# Patient Record
Sex: Female | Born: 1965 | Race: White | Hispanic: No | Marital: Married | Smoking: Former smoker
Health system: Southern US, Community
[De-identification: ages and names within clinical notes are randomized; demographics above are authoritative.]

## PROBLEM LIST (undated history)

## (undated) HISTORY — PX: ABDOMINAL HYSTERECTOMY: SHX81

## (undated) HISTORY — PX: TUBAL LIGATION: SHX77

## (undated) HISTORY — PX: DILATION AND CURETTAGE OF UTERUS: SHX78

---

## 2014-05-24 ENCOUNTER — Emergency Department: Payer: Self-pay | Admitting: Emergency Medicine

## 2014-05-24 LAB — COMPREHENSIVE METABOLIC PANEL
ALBUMIN: 3.2 g/dL — AB (ref 3.4–5.0)
ALK PHOS: 84 U/L
Anion Gap: 7 (ref 7–16)
BUN: 10 mg/dL (ref 7–18)
Bilirubin,Total: 0.5 mg/dL (ref 0.2–1.0)
CREATININE: 0.66 mg/dL (ref 0.60–1.30)
Calcium, Total: 8.4 mg/dL — ABNORMAL LOW (ref 8.5–10.1)
Chloride: 103 mmol/L (ref 98–107)
Co2: 28 mmol/L (ref 21–32)
EGFR (African American): >60
EGFR (Non-African Amer.): >60
GLUCOSE: 119 mg/dL — AB (ref 65–99)
OSMOLALITY: 276 (ref 275–301)
POTASSIUM: 3.4 mmol/L — AB (ref 3.5–5.1)
SGOT(AST): 18 U/L (ref 15–37)
SGPT (ALT): 15 U/L
SODIUM: 138 mmol/L (ref 136–145)
TOTAL PROTEIN: 7.1 g/dL (ref 6.4–8.2)

## 2014-05-24 LAB — URINALYSIS, COMPLETE
BILIRUBIN, UR: NEGATIVE
Bacteria: NONE SEEN
Bacteria: NONE SEEN
Bilirubin,UR: NEGATIVE
Glucose,UR: NEGATIVE mg/dL (ref 0–75)
Glucose,UR: NEGATIVE mg/dL (ref 0–75)
KETONE: NEGATIVE
LEUKOCYTE ESTERASE: NEGATIVE
NITRITE: NEGATIVE
Nitrite: NEGATIVE
Ph: 7 (ref 4.5–8.0)
Ph: 6 (ref 4.5–8.0)
Protein: 30
Protein: NEGATIVE
RBC,UR: 139 /HPF (ref 0–5)
RBC,UR: 13 /HPF (ref 0–5)
SPECIFIC GRAVITY: 1.006 (ref 1.003–1.030)
Specific Gravity: 1.008 (ref 1.003–1.030)
Squamous Epithelial: 5
Squamous Epithelial: 2
WBC UR: 1 /HPF (ref 0–5)

## 2014-05-24 LAB — CBC WITH DIFFERENTIAL/PLATELET
BASOS PCT: 0.4 %
Basophil #: 0 10*3/uL (ref 0.0–0.1)
Eosinophil #: 0.1 10*3/uL (ref 0.0–0.7)
Eosinophil %: 1.1 %
HCT: 37 % (ref 35.0–47.0)
HGB: 12 g/dL (ref 12.0–16.0)
Lymphocyte #: 1.8 10*3/uL (ref 1.0–3.6)
Lymphocyte %: 15.3 %
MCH: 29.6 pg (ref 26.0–34.0)
MCHC: 32.4 g/dL (ref 32.0–36.0)
MCV: 92 fL (ref 80–100)
Monocyte #: 0.7 x10 3/mm (ref 0.2–0.9)
Monocyte %: 6.3 %
NEUTROS PCT: 76.9 %
Neutrophil #: 9 10*3/uL — ABNORMAL HIGH (ref 1.4–6.5)
Platelet: 308 10*3/uL (ref 150–440)
RBC: 4.05 10*6/uL (ref 3.80–5.20)
RDW: 13.2 % (ref 11.5–14.5)
WBC: 11.8 10*3/uL — ABNORMAL HIGH (ref 3.6–11.0)

## 2014-05-24 LAB — PREGNANCY, URINE: PREGNANCY TEST, URINE: NEGATIVE m[IU]/mL

## 2014-05-24 LAB — LIPASE, BLOOD: LIPASE: 82 U/L (ref 73–393)

## 2014-05-24 LAB — GC/CHLAMYDIA PROBE AMP

## 2014-05-24 LAB — TSH: THYROID STIMULATING HORM: 1.87 u[IU]/mL

## 2014-05-24 LAB — WET PREP, GENITAL

## 2014-05-29 ENCOUNTER — Ambulatory Visit: Payer: Self-pay | Admitting: Obstetrics & Gynecology

## 2014-06-10 ENCOUNTER — Ambulatory Visit: Payer: Self-pay | Admitting: Obstetrics & Gynecology

## 2014-06-23 ENCOUNTER — Ambulatory Visit: Payer: Self-pay | Admitting: Obstetrics & Gynecology

## 2014-09-07 LAB — SURGICAL PATHOLOGY

## 2014-09-13 NOTE — Op Note (Signed)
PATIENT NAME:  Deborah Morris, Deborah Morris MR#:  [id number] DATE OF BIRTH:  07/18/1965  DATE OF PROCEDURE:  05/29/2014  PREOPERATIVE DIAGNOSIS: Abnormal uterine bleeding and mass in the endometrium.   POSTOPERATIVE DIAGNOSIS: Abnormal uterine bleeding, endometrial polyp.   PROCEDURE: Hysteroscopy, dilation and curettage.  SURGEON: Ranae Plumberhelsea Ward, MD  ANESTHESIA: General.   ESTIMATED BLOOD LOSS: Minimal.  OPERATIVE FLUIDS: 800 mL.  URINE OUTPUT: None collected.   FINDINGS: 1.  An 18 week size mobile uterus sounding to 10 cm.  2.  Large polyp within the endometrium with a wide base near the left ostium.  3.  A fair amount of endometrial tissue. 4.  Engorged hemorrhoids.   SPECIMENS: 1.  Endometrial polyp.  2.  Endometrial curettings.   COMPLICATIONS: None apparent.   DISPOSITION: Stable to PACU for recovery.  INDICATION FOR PROCEDURE: The patient is a 49 year old who presented to the Emergency Room on May 24, 2014 with heavy vaginal bleeding and pelvic pain. She was evaluated via CT scan and found to have an enlarged heterogeneous uterus with a mass within the endometrium as well as a drastically dilated right fallopian tube. She was seen by me in the Emergency Room, given Megace 40 mg b.i.d. for controlling of the symptoms of bleeding and was scheduled for today's procedure. Informed consent was obtained.   DESCRIPTION OF PROCEDURE: The patient was brought back to the operating room where she was identified as Deborah Morris, given endotracheal intubation and placed in the dorsal lithotomy position in candy cane stirrups. She was then prepped and draped in the usual sterile fashion. A timeout was called. A weighted speculum was placed in the vagina and a Sims retractor was used to visualize the cervix, which was grasped at the 12 o'clock position with a single-tooth tenaculum and brought forward. The uterus was sounded to 10 cm and then dilated with sequential Pratt dilators in order to  accommodate the hysteroscope, which was inserted with findings as above. The hysteroscope was removed and then a small ring forceps was placed inside the uterine cavity and grasped the endometrial polyp. This was twisted until there was give and then the polyp was removed. Following this a sharp gentle curettage was performed with fair amount of tissue returned, which was collected and sent off as endometrial curettings. The hysteroscope was then reinserted. It was difficult to see due to the bleeding, tissue and debris; however, there was a small amount of tissue left in the site near where the base of the polyp had been, so the hysteroscope was removed and then sharp curettage was again performed with return of a small amount of tissue. There was a good cry in all 4 quadrants of the uterus and after this the procedure was deemed complete. All the instruments were removed from the patient and there was no bleeding from the tenaculum site. The counts were correct x2 and the patient was recovered and brought back to PACU in stable condition for recovery.  ____________________________ Elenora Fenderhelsea C. Ward, MD ccw:sb D: 05/29/2014 14:09:51 ET T: 05/29/2014 14:38:00 ET JOB#: [id number]  cc: Chelsea C. Ward, MD, <Dictator> Leola BrazilHELSEA C WARD MD ELECTRONICALLY SIGNED 05/30/2014 9:00

## 2014-09-13 NOTE — Op Note (Signed)
PATIENT NAME:  LILITH, SOLANA MR#:  [id number] DATE OF BIRTH:  03-Nov-1965  DATE OF PROCEDURE:  06/23/2014  PREOPERATIVE DIAGNOSIS: Abnormal uterine bleeding and pelvic mass.    POSTOPERATIVE DIAGNOSIS:  Abnormal uterine bleeding.   PROCEDURE:  Robotic-assisted total laparoscopic hysterectomy and bilateral salpingectomy.   SURGEON: Ranae Plumber, MD.    ASSISTANT: Vena Austria, MD  ANESTHESIA: General via endotracheal route.   ESTIMATED BLOOD LOSS: Minimal.   OPERATIVE FLUIDS:  1250 mL of crystalloid.    URINE OUTPUT:  400 mL.    FINDINGS:  1. Normal-appearing top sized uterus.  2. Normal appearing previously ligated bilateral fallopian tubes.  3. Normal-appearing bilateral ovaries.  4. Normal-appearing upper abdomen.   SPECIMENS:  Uterus, cervix, tubes and ovaries.   COMPLICATIONS: None apparent.   DISPOSITION: Stable to PACU for recovery.   INDICATION FOR PROCEDURE: Miss Martorano is a 49 year old who had presented to the Emergency Room with very heavy uterine bleeding and on CT was found to have a mass within the endometrial cavity as well as a right-sided adnexal mass consistent with fallopian tube or ovary. D and C was performed to rule out malignancy, which was benign, although a large endometrial polyp was found. Following this procedure the patient continued to have abnormal bleeding and requested a total hysterectomy and bilateral salpingectomy with preservation of bilateral ovaries if possible for definitive resolution of her uterine bleeding. The variety of options were discussed with her both medical and surgical and she still elected to have a hysterectomy for definitive treatment. Informed consent was obtained.   OPERATIVE NOTE: The patient was brought back to the Operating Room, identified as Julienne Kass, and placed in the supine position while general anesthesia was administered via the endotracheal route. She was then placed in the dorsal lithotomy position in  allen stirrups and prepped and draped in the usual sterile fashion. A timeout was called. The speculum was placed into the vagina, visualizing the cervix which was grasped with a single-tooth tenaculum at 12 o'clock and the [facility] manipulator was inserted and positioned without difficulty. A Foley catheter was then placed. After a change of gloves the attention was turned to the abdomen where a 12 mm incision was made 4 cm superior to the umbilicus and a Veress needle was inserted. A saline drop test was used to confirm position within the peritoneal cavity and then a pneumoperitoneum was created. The opening pressure was 4, the maximum pressure was 15 mmHg.  Once the pneumoperitoneum was created the Veress needle was removed and then a 12 mm balloon port was inserted. After this the camera was inserted and visualization of the abdominal cavity was as described above. Two 8 mm robotic ports were placed 1 on each left and right upper abdomen and then a 12 mm assistant port was placed in the right lateral mid abdomen. All ports were placed under visualization and without difficulty. The robot was then assembled and docked.  [place] bipolar and a monopolar cautery hook were employed after the patient was placed in deep Trendelenburg. The bilateral fallopian tubes were grasped and dissected down the mesosalpinx from the fibriated ends to the cornua and the uteroovarian arteries were cauterized and ligated. The round ligaments were also cauterized and ligated and the anterior leaves of the broad ligament were dissected down and across the colpotomy cup of the VCare in order to create a bladder flap. The bladder was lifted and swept out of the way. The posterior leaves of the broad ligament  were dissected down exposing and skeletonizing the uterine arteries, which were cauterized and dissected away from the uterus and cervix. Hemostasis was noted. The cautery hook was then used to create the colpotomy in a circumferential  manner around the cervical cup. The uterus and cervix and bilateral tubes were then retracted through the vagina and then a wet sponge was placed into the vaginal canal to maintain the pneumoperitoneum. The cautery hook was then exchanged for a Mega needle driver and using a running stitch of 0-V-Loc suture, the vaginal cuff was closed. This was doubled back on itself in order to insure integrity of the suture closure. At this time the procedure was deemed complete. An Inlet closure device was used to close the fascia of the 12 mm assistant port and the 8 mm ports were also removed. The 12 mm balloon port was removed and the fascia was grasped with a Kocher and then a running 0 Vicryl stitch was placed to insure closure of this port site. The skin of all 4 port sites was closed using 4-0 Monocryl and then covered in Dermabond. The Foley catheter was removed from the patient prior to leaving the operating room as was the sponge that had been placed in the vagina. The patient tolerated the procedure. The counts were correct x 2, then the patient was brought back to recovery in a stable condition.    ____________________________ Elenora Fenderhelsea C. Joyel Chenette, MD ccw:bu D: 06/23/2014 15:21:56 ET T: 06/23/2014 17:44:59 ET JOB#: [id number]  cc: Leeroy Bockhelsea C. Sanari Offner, MD, <Dictator> Leola BrazilHELSEA C Sachit Gilman MD ELECTRONICALLY SIGNED 06/26/2014 14:09

## 2015-02-04 ENCOUNTER — Encounter
Admission: RE | Admit: 2015-02-04 | Discharge: 2015-02-04 | Disposition: A | Discharge status: Home / Self Care | Payer: BLUE CROSS/BLUE SHIELD | Source: Ambulatory Visit | Attending: Obstetrics & Gynecology | Admitting: Obstetrics & Gynecology

## 2015-02-04 DIAGNOSIS — Z01812 Encounter for preprocedural laboratory examination: Secondary | ICD-10-CM | POA: Insufficient documentation

## 2015-02-04 LAB — BASIC METABOLIC PANEL
Anion gap: 7 (ref 5–15)
BUN: 15 mg/dL (ref 6–20)
CALCIUM: 9.1 mg/dL (ref 8.9–10.3)
CHLORIDE: 103 mmol/L (ref 101–111)
CO2: 30 mmol/L (ref 22–32)
CREATININE: 0.68 mg/dL (ref 0.44–1.00)
GFR calc non Af Amer: >60 mL/min (ref >60)
Glucose, Bld: 78 mg/dL (ref 65–99)
Potassium: 3.6 mmol/L (ref 3.5–5.1)
SODIUM: 140 mmol/L (ref 135–145)

## 2015-02-04 LAB — CBC
HCT: 40.2 % (ref 35.0–47.0)
HEMOGLOBIN: 13.7 g/dL (ref 12.0–16.0)
MCH: 31.2 pg (ref 26.0–34.0)
MCHC: 34 g/dL (ref 32.0–36.0)
MCV: 91.7 fL (ref 80.0–100.0)
Platelets: 276 10*3/uL (ref 150–440)
RBC: 4.38 MIL/uL (ref 3.80–5.20)
RDW: 12.7 % (ref 11.5–14.5)
WBC: 6.5 10*3/uL (ref 3.6–11.0)

## 2015-02-04 LAB — TYPE AND SCREEN
ABO/RH(D): O POS
ANTIBODY SCREEN: NEGATIVE

## 2015-02-04 LAB — ABO/RH: ABO/RH(D): O POS

## 2015-02-04 NOTE — Patient Instructions (Signed)
  Your procedure is scheduled on: February 11, 2015 (Thursday) Report to Day Surgery. To find out your arrival time please call [phone] between 1PM - 3PM on February 10, 2015 (Wednesday).  Remember: Instructions that are not followed completely may result in serious medical risk, up to and including death, or upon the discretion of your surgeon and anesthesiologist your surgery may need to be rescheduled.    __x__ 1. Do not eat food or drink liquids after midnight. No gum chewing or hard candies.     ____ 2. No Alcohol for 24 hours before or after surgery.   ____ 3. Bring all medications with you on the day of surgery if instructed.    __x__ 4. Notify your doctor if there is any change in your medical condition     (cold, fever, infections).     Do not wear jewelry, make-up, hairpins, clips or nail polish.  Do not wear lotions, powders, or perfumes. You may wear deodorant.  Do not shave 48 hours prior to surgery. Men may shave face and neck.  Do not bring valuables to the hospital.    [facility] is not responsible for any belongings or valuables.               Contacts, dentures or bridgework may not be worn into surgery.  Leave your suitcase in the car. After surgery it may be brought to your room.  For patients admitted to the hospital, discharge time is determined by your                treatment team.   Patients discharged the day of surgery will not be allowed to drive home.   Please read over the following fact sheets that you were given:      ____ Take these medicines the morning of surgery with A SIP OF WATER:    1.   2.   3.   4.  5.  6.  ____ Fleet Enema (as directed)   ____ Use CHG Soap as directed  ____ Use inhalers on the day of surgery  ____ Stop metformin 2 days prior to surgery    ____ Take 1/2 of usual insulin dose the night before surgery and none on the morning of surgery.   ____ Stop Coumadin/Plavix/aspirin on   __x__ Stop  Anti-inflammatories on (STOP ADVIL NOW, TYLENOL OK TO TAKE FOR PAIN)   ____ Stop supplements until after surgery.    ____ Bring C-Pap to the hospital.

## 2015-02-11 ENCOUNTER — Ambulatory Visit: Payer: BLUE CROSS/BLUE SHIELD | Admitting: Anesthesiology

## 2015-02-11 ENCOUNTER — Encounter
Admission: RE | Disposition: A | Discharge status: Home / Self Care | Payer: Self-pay | Source: Ambulatory Visit | Attending: Obstetrics & Gynecology

## 2015-02-11 ENCOUNTER — Ambulatory Visit
Admission: RE | Admit: 2015-02-11 | Discharge: 2015-02-11 | Disposition: A | Discharge status: Home / Self Care | Payer: BLUE CROSS/BLUE SHIELD | Source: Ambulatory Visit | Attending: Obstetrics & Gynecology | Admitting: Obstetrics & Gynecology

## 2015-02-11 DIAGNOSIS — Z842 Family history of other diseases of the genitourinary system: Secondary | ICD-10-CM | POA: Insufficient documentation

## 2015-02-11 DIAGNOSIS — Z79899 Other long term (current) drug therapy: Secondary | ICD-10-CM | POA: Insufficient documentation

## 2015-02-11 DIAGNOSIS — Z87891 Personal history of nicotine dependence: Secondary | ICD-10-CM | POA: Insufficient documentation

## 2015-02-11 DIAGNOSIS — N842 Polyp of vagina: Secondary | ICD-10-CM | POA: Insufficient documentation

## 2015-02-11 DIAGNOSIS — Z800 Family history of malignant neoplasm of digestive organs: Secondary | ICD-10-CM | POA: Diagnosis not present

## 2015-02-11 HISTORY — PX: REPAIR VAGINAL CUFF: SHX6067

## 2015-02-11 SURGERY — REPAIR, VAGINAL CUFF
Anesthesia: General

## 2015-02-11 MED ORDER — FENTANYL CITRATE (PF) 100 MCG/2ML IJ SOLN: 25.0000 ug | INTRAMUSCULAR | Status: DC | PRN

## 2015-02-11 MED ORDER — PROPOFOL 10 MG/ML IV BOLUS
INTRAVENOUS | Status: DC | PRN
  Administered 2015-02-11: 130 mg via INTRAVENOUS

## 2015-02-11 MED ORDER — LACTATED RINGERS IV SOLN
INTRAVENOUS | Status: DC
  Administered 2015-02-11: 06:00:00 via INTRAVENOUS

## 2015-02-11 MED ORDER — MIDAZOLAM HCL 2 MG/2ML IJ SOLN
INTRAMUSCULAR | Status: DC | PRN
  Administered 2015-02-11: 1 mg via INTRAVENOUS

## 2015-02-11 MED ORDER — FENTANYL CITRATE (PF) 100 MCG/2ML IJ SOLN
INTRAMUSCULAR | Status: DC | PRN
  Administered 2015-02-11: 25 ug via INTRAVENOUS
  Administered 2015-02-11: 75 ug via INTRAVENOUS
  Administered 2015-02-11: 25 ug via INTRAVENOUS

## 2015-02-11 MED ORDER — LIDOCAINE HCL (CARDIAC) 20 MG/ML IV SOLN
INTRAVENOUS | Status: DC | PRN
  Administered 2015-02-11: 100 mg via INTRAVENOUS

## 2015-02-11 MED ORDER — CEFAZOLIN SODIUM-DEXTROSE 2-3 GM-% IV SOLR
2.0000 g | Freq: Once | INTRAVENOUS | Status: AC
  Administered 2015-02-11: 2 g via INTRAVENOUS

## 2015-02-11 MED ORDER — ONDANSETRON HCL 4 MG/2ML IJ SOLN
INTRAMUSCULAR | Status: DC | PRN
  Administered 2015-02-11: 4 mg via INTRAVENOUS

## 2015-02-11 MED ORDER — GLYCOPYRROLATE 0.2 MG/ML IJ SOLN
INTRAMUSCULAR | Status: DC | PRN
  Administered 2015-02-11: 0.2 mg via INTRAVENOUS

## 2015-02-11 MED ORDER — KETOROLAC TROMETHAMINE 30 MG/ML IJ SOLN
INTRAMUSCULAR | Status: DC | PRN
  Administered 2015-02-11: 30 mg via INTRAVENOUS

## 2015-02-11 MED ORDER — FAMOTIDINE 20 MG PO TABS
20.0000 mg | ORAL_TABLET | Freq: Once | ORAL | Status: AC
  Administered 2015-02-11: 20 mg via ORAL

## 2015-02-11 MED ORDER — ONDANSETRON HCL 4 MG/2ML IJ SOLN: 4.0000 mg | Freq: Once | INTRAMUSCULAR | Status: DC | PRN

## 2015-02-11 MED ORDER — DEXAMETHASONE SODIUM PHOSPHATE 4 MG/ML IJ SOLN
INTRAMUSCULAR | Status: DC | PRN
  Administered 2015-02-11: 5 mg via INTRAVENOUS

## 2015-02-11 SURGICAL SUPPLY — 33 items
BAG URO DRAIN 2000ML W/SPOUT (MISCELLANEOUS) ×3 IMPLANT
BLADE SURG SZ10 CARB STEEL (BLADE) ×3 IMPLANT
CANISTER SUCT 1200ML W/VALVE (MISCELLANEOUS) ×3 IMPLANT
CATH FOLEY 2WAY  5CC 16FR (CATHETERS) ×2
CATH URTH 16FR FL 2W BLN LF (CATHETERS) ×1 IMPLANT
DRAPE PERI LITHO V/GYN (MISCELLANEOUS) ×3 IMPLANT
DRAPE SHEET LG 3/4 BI-LAMINATE (DRAPES) ×3 IMPLANT
DRAPE SURG 17X11 SM STRL (DRAPES) ×3 IMPLANT
DRAPE UNDER BUTTOCK W/FLU (DRAPES) ×3 IMPLANT
DRESSING TELFA 4X3 1S ST N-ADH (GAUZE/BANDAGES/DRESSINGS) ×3 IMPLANT
GLOVE BIOGEL PI IND STRL 6.5 (GLOVE) ×1 IMPLANT
GLOVE BIOGEL PI INDICATOR 6.5 (GLOVE) ×2
GLOVE SURG SYN 6.5 ES PF (GLOVE) ×18 IMPLANT
GOWN STRL REUS W/ TWL LRG LVL3 (GOWN DISPOSABLE) ×2 IMPLANT
GOWN STRL REUS W/TWL LRG LVL3 (GOWN DISPOSABLE) ×4
KIT RM TURNOVER CYSTO AR (KITS) ×3 IMPLANT
NDL SAFETY 18GX1.5 (NEEDLE) ×3 IMPLANT
NDL SAFETY 22GX1.5 (NEEDLE) ×3 IMPLANT
NS IRRIG 500ML POUR BTL (IV SOLUTION) ×3 IMPLANT
PACK BASIN MINOR ARMC (MISCELLANEOUS) ×3 IMPLANT
PAD GROUND ADULT SPLIT (MISCELLANEOUS) ×3 IMPLANT
PAD OB MATERNITY 4.3X12.25 (PERSONAL CARE ITEMS) ×3 IMPLANT
PAD PREP 24X41 OB/GYN DISP (PERSONAL CARE ITEMS) ×3 IMPLANT
SPONGE XRAY 4X4 16PLY STRL (MISCELLANEOUS) ×3 IMPLANT
SUT CHROMIC 3 0 SH 27 (SUTURE) ×3 IMPLANT
SUT ETHIBOND NAB CT1 #1 30IN (SUTURE) ×3 IMPLANT
SUT VIC AB 0 CT1 27 (SUTURE) ×6
SUT VIC AB 0 CT1 27XCR 8 STRN (SUTURE) ×3 IMPLANT
SUT VIC AB 0 CT1 36 (SUTURE) ×9 IMPLANT
SUT VIC AB 1 CT1 36 (SUTURE) ×3 IMPLANT
SUT VIC AB 2-0 CT1 (SUTURE) ×3 IMPLANT
SYR CONTROL 10ML (SYRINGE) ×3 IMPLANT
SYRINGE 10CC LL (SYRINGE) ×3 IMPLANT

## 2015-02-11 NOTE — Transfer of Care (Signed)
Immediate Anesthesia Transfer of Care Note  Patient: Deborah Morris  Procedure(s) Performed: Procedure(s): REPAIR VAGINAL CUFF (N/A)  Patient Location: PACU  Anesthesia Type:General  Level of Consciousness: awake, oriented and patient cooperative  Airway & Oxygen Therapy: Patient Spontanous Breathing and Patient connected to nasal cannula oxygen  Post-op Assessment: Report given to RN and Post -op Vital signs reviewed and stable  Post vital signs: Reviewed and stable  Last Vitals:  Filed Vitals:   02/11/15 0853  BP: 124/89  Pulse: 101  Temp: 37.1 C  Resp: 16    Complications: No apparent anesthesia complications

## 2015-02-11 NOTE — Anesthesia Postprocedure Evaluation (Signed)
  Anesthesia Post-op Note  Patient: Deborah Morris  Procedure(s) Performed: Procedure(s): REPAIR VAGINAL CUFF (N/A)  Anesthesia type:General  Patient location: PACU  Post pain: Pain level controlled  Post assessment: Post-op Vital signs reviewed, Patient's Cardiovascular Status Stable, Respiratory Function Stable, Patent Airway and No signs of Nausea or vomiting  Post vital signs: Reviewed and stable  Last Vitals:  Filed Vitals:   02/11/15 0938  BP: 108/84  Pulse: 76  Temp: 36.8 C  Resp: 12    Level of consciousness: awake, alert  and patient cooperative  Complications: No apparent anesthesia complications

## 2015-02-11 NOTE — Anesthesia Preprocedure Evaluation (Addendum)
Anesthesia Evaluation  Patient identified by MRN, date of birth, ID band Patient awake    Reviewed: Allergy & Precautions, NPO status , Patient's Chart, lab work & pertinent test results  History of Anesthesia Complications Negative for: history of anesthetic complications  Airway Mallampati: II       Dental  (+) Teeth Intact   Pulmonary former smoker (quit x 13 yrs),           Cardiovascular negative cardio ROS       Neuro/Psych negative neurological ROS     GI/Hepatic negative GI ROS, Neg liver ROS,   Endo/Other  negative endocrine ROS  Renal/GU negative Renal ROS     Musculoskeletal   Abdominal   Peds  Hematology negative hematology ROS (+)   Anesthesia Other Findings   Reproductive/Obstetrics                            Anesthesia Physical Anesthesia Plan  ASA: II  Anesthesia Plan: General   Post-op Pain Management:    Induction: Intravenous  Airway Management Planned: LMA  Additional Equipment:   Intra-op Plan:   Post-operative Plan:   Informed Consent: I have reviewed the patients History and Physical, chart, labs and discussed the procedure including the risks, benefits and alternatives for the proposed anesthesia with the patient or authorized representative who has indicated his/her understanding and acceptance.     Plan Discussed with:   Anesthesia Plan Comments:        Anesthesia Quick Evaluation

## 2015-02-11 NOTE — Anesthesia Procedure Notes (Signed)
Procedure Name: LMA Insertion Date/Time: 02/11/2015 7:45 AM Performed by: Shirlee Limerick, DAVID Pre-anesthesia Checklist: Patient identified, Emergency Drugs available, Suction available and Patient being monitored Oxygen Delivery Method: Circle system utilized Preoxygenation: Pre-oxygenation with 100% oxygen Intubation Type: IV induction LMA: LMA inserted LMA Size: 4.0 Dental Injury: Teeth and Oropharynx as per pre-operative assessment

## 2015-02-11 NOTE — Op Note (Signed)
Deborah Morris PROCEDURE DATE: 02/11/2015  PATIENT:  Deborah Morris  49 y.o. female  PRE-OPERATIVE DIAGNOSIS:  VAGINAL POLYP  POST-OPERATIVE DIAGNOSIS:  vaginal polyp  PROCEDURE:  Procedure(s): REPAIR VAGINAL CUFF (N/A)  SURGEON:  Surgeon(s) and Role:    * Chelsea C Ward, MD - Primary    * Nadara Mustard, MD - Assist  ANESTHESIA:  General via ET  I/O  Total I/O In: 500 [I.V.:500] Out: 405 [Urine:400; Blood:5]  FINDINGS: Pedunculated polyp from otherwise closed and well-healed vaginal cuff, midline, central.   SPECIMEN: Vaginal cuff polyp  COMPLICATIONS: none apparent  DISPOSITION: vital signs stable to PACU   Indication for Surgery: 49 y.o. who was s/p a hysterectomy in February presented with complaints of post coital bleeding.  She was evaluated and found to have a granulation tissue polyp protruding from the vaginal cuff.    Risks of surgery were discussed with the patient including but not limited to: bleeding which may require transfusion or reoperation; infection which may require antibiotics; injury to bowel, bladder, ureters or other surrounding organs; need for additional procedures including laparotomy, blood clot, incisional problems and other postoperative/anesthesia complications. Written informed consent was obtained.    FINDINGS:    PROCEDURE IN DETAIL:  The patient had sequential compression devices applied to her lower extremities while in the preoperative area.  She was then taken to the operating room where general anesthesia was administered and was found to be adequate.  She was placed in the dorsal lithotomy position, and was prepped and draped in a sterile manner.  A Foley catheter was inserted into her bladder and attached to constant drainage.  Retractors were placed in order to visualize the vaginal cuff.  Alis clamps were used to extract the tissue from the vaginal cuff.  They were then placed on the site of the origin of the polyp, and a 20 blade  was used to excise this area.  The abdominal cavity was opened but not entered.  3-0 chromic was used to reapproximate the peritoneal surface and an imbricating layer of the vaginal tissues was placed with 0-vicryl in vertical mattress stitches.   The operative site was surveyed, and it was found to be hemostatic.  No intraoperative injury to surrounding organs was noted.  The patient tolerated the procedure well.  All instruments, needles, and sponge counts were correct x 2. The patient was taken to the recovery room in stable condition.   ---- Ranae Plumber, MD Attending Obstetrician and Gynecologist [facility]

## 2015-02-11 NOTE — Discharge Instructions (Signed)
Discharge instructions:   Call office if you have any of the following: headache, visual changes, fever >101 F, chills, excessive vaginal bleeding, leg pain or redness or any other concerns.   Activity: Do not lift > 10 lbs for 6 weeks.  No intercourse or tampons for 6 weeks.  No driving for 1-2 weeks.   Call your doctor for increased pain or vaginal bleeding, temperature above 101.0, or concerns.  No strenuous activity or heavy lifting for 6 weeks.  No intercourse, tampons, douching for 6 weeks.  No tub bathsAMBULATORY SURGERY  DISCHARGE INSTRUCTIONS   1) The drugs that you were given will stay in your system until tomorrow so for the next 24 hours you should not:  A) Drive an automobile B) Make any legal decisions C) Drink any alcoholic beverage   2) You may resume regular meals tomorrow.  Today it is better to start with liquids and gradually work up to solid foods.  You may eat anything you prefer, but it is better to start with liquids, then soup and crackers, and gradually work up to solid foods.   3) Please notify your doctor immediately if you have any unusual bleeding, trouble breathing, redness and pain at the surgery site, drainage, fever, or pain not relieved by medication.    4) Additional Instructions:        Please contact your physician with any problems or Same Day Surgery at [phone], Monday through Friday 6 am to 4 pm, or  at [facility] number at [phone].-showers only.  No driving while taking pain medications.

## 2015-02-11 NOTE — H&P (Signed)
H&P Update  PLEASE SEE PAPER H&P  Pt was last seen in my office, and complete history and physical performed.  The surgical history has been reviewed and remains accurate without interval change. The patient was re-examined and patient's physiologic condition has not changed significantly in the last 30 days.  No new pharmacological allergies or types of therapy has been initiated.  No Known Allergies  History reviewed. No pertinent past medical history. Past Surgical History  Procedure Laterality Date  . Abdominal hysterectomy    . Tubal ligation    . Dilation and curettage of uterus      BP 107/68 mmHg  Pulse 78  Temp(Src) 98.6 F (37 C) (Tympanic)  Resp 16  Ht  (1.6 m)  Wt 72.576 kg (160 lb)  BMI 28.35 kg/m2  SpO2 97%  NAD RRR no murmurs CTAB, no wheezing, resps unlabored +BS, soft, NTTP No c/c/e Pelvic exam deferred  The above history was confirmed with the patient. The condition still exists that makes this procedure necessary. Surgical plan includes REVISION OF VAGINAL CUFF, POSSIBLE LAPAROSCOPY as confirmed on the consent. The treatment plan remains the same, without new options for care.  The patient understands the potential benefits and risks and the consents have been signed and placed on the chart.     Ranae Plumber, MD Attending Obstetrician Gynecologist [facility]

## 2015-02-12 LAB — SURGICAL PATHOLOGY

## 2015-09-20 ENCOUNTER — Encounter: Payer: Self-pay | Admitting: Gynecology

## 2015-09-20 ENCOUNTER — Ambulatory Visit
Admission: EM | Admit: 2015-09-20 | Discharge: 2015-09-20 | Disposition: A | Discharge status: Home / Self Care | Payer: Self-pay | Attending: Family Medicine | Admitting: Family Medicine

## 2015-09-20 DIAGNOSIS — L03119 Cellulitis of unspecified part of limb: Secondary | ICD-10-CM

## 2015-09-20 DIAGNOSIS — L237 Allergic contact dermatitis due to plants, except food: Secondary | ICD-10-CM

## 2015-09-20 MED ORDER — PREDNISONE 20 MG PO TABS: ORAL_TABLET | ORAL | Status: AC

## 2015-09-20 MED ORDER — CEPHALEXIN 500 MG PO CAPS: 500.0000 mg | ORAL_CAPSULE | Freq: Three times a day (TID) | ORAL | Status: AC

## 2015-09-20 NOTE — ED Notes (Signed)
Patient c/o rash all over body x 4 days.

## 2015-09-20 NOTE — Discharge Instructions (Signed)
Contact Dermatitis Dermatitis is redness, soreness, and swelling (inflammation) of the skin. Contact dermatitis is a reaction to certain substances that touch the skin. There are two types of contact dermatitis:   Irritant contact dermatitis. This type is caused by something that irritates your skin, such as dry hands from washing them too much. This type does not require previous exposure to the substance for a reaction to occur. This type is more common.  Allergic contact dermatitis. This type is caused by a substance that you are allergic to, such as a nickel allergy or poison ivy. This type only occurs if you have been exposed to the substance (allergen) before. Upon a repeat exposure, your body reacts to the substance. This type is less common. CAUSES  Many different substances can cause contact dermatitis. Irritant contact dermatitis is most commonly caused by exposure to:   Makeup.   Soaps.   Detergents.   Bleaches.   Acids.   Metal salts, such as nickel.  Allergic contact dermatitis is most commonly caused by exposure to:   Poisonous plants.   Chemicals.   Jewelry.   Latex.   Medicines.   Preservatives in products, such as clothing.  RISK FACTORS This condition is more likely to develop in:   People who have jobs that expose them to irritants or allergens.  People who have certain medical conditions, such as asthma or eczema.  SYMPTOMS  Symptoms of this condition may occur anywhere on your body where the irritant has touched you or is touched by you. Symptoms include:  Dryness or flaking.   Redness.   Cracks.   Itching.   Pain or a burning feeling.   Blisters.  Drainage of small amounts of blood or clear fluid from skin cracks. With allergic contact dermatitis, there may also be swelling in areas such as the eyelids, mouth, or genitals.  DIAGNOSIS  This condition is diagnosed with a medical history and physical exam. A patch skin test  may be performed to help determine the cause. If the condition is related to your job, you may need to see an occupational medicine specialist. TREATMENT Treatment for this condition includes figuring out what caused the reaction and protecting your skin from further contact. Treatment may also include:   Steroid creams or ointments. Oral steroid medicines may be needed in more severe cases.  Antibiotics or antibacterial ointments, if a skin infection is present.  Antihistamine lotion or an antihistamine taken by mouth to ease itching.  A bandage (dressing). HOME CARE INSTRUCTIONS Skin Care  Moisturize your skin as needed.   Apply cool compresses to the affected areas.  Try taking a bath with:  Epsom salts. Follow the instructions on the packaging. You can get these at your local pharmacy or grocery store.  Baking soda. Pour a small amount into the bath as directed by your health care provider.  Colloidal oatmeal. Follow the instructions on the packaging. You can get this at your local pharmacy or grocery store.  Try applying baking soda paste to your skin. Stir water into baking soda until it reaches a paste-like consistency.  Do not scratch your skin.  Bathe less frequently, such as every other day.  Bathe in lukewarm water. Avoid using hot water. Medicines  Take or apply over-the-counter and prescription medicines only as told by your health care provider.   If you were prescribed an antibiotic medicine, take or apply your antibiotic as told by your health care provider. Do not stop using the   antibiotic even if your condition starts to improve. General Instructions  Keep all follow-up visits as told by your health care provider. This is important.  Avoid the substance that caused your reaction. If you do not know what caused it, keep a journal to try to track what caused it. Write down:  What you eat.  What cosmetic products you use.  What you drink.  What  you wear in the affected area. This includes jewelry.  If you were given a dressing, take care of it as told by your health care provider. This includes when to change and remove it. SEEK MEDICAL CARE IF:   Your condition does not improve with treatment.  Your condition gets worse.  You have signs of infection such as swelling, tenderness, redness, soreness, or warmth in the affected area.  You have a fever.  You have new symptoms. SEEK IMMEDIATE MEDICAL CARE IF:   You have a severe headache, neck pain, or neck stiffness.  You vomit.  You feel very sleepy.  You notice red streaks coming from the affected area.  Your bone or joint underneath the affected area becomes painful after the skin has healed.  The affected area turns darker.  You have difficulty breathing.   This information is not intended to replace advice given to you by your health care provider. Make sure you discuss any questions you have with your health care provider.   Document Released: 04/28/2000 Document Revised: 01/20/2015 Document Reviewed: 09/16/2014 Elsevier Interactive Patient Education 2016 [employer]  

## 2015-10-09 ENCOUNTER — Encounter: Payer: Self-pay | Admitting: Urgent Care

## 2015-10-09 DIAGNOSIS — S92211A Displaced fracture of cuboid bone of right foot, initial encounter for closed fracture: Secondary | ICD-10-CM | POA: Insufficient documentation

## 2015-10-09 DIAGNOSIS — W109XXA Fall (on) (from) unspecified stairs and steps, initial encounter: Secondary | ICD-10-CM | POA: Insufficient documentation

## 2015-10-09 DIAGNOSIS — Y9389 Activity, other specified: Secondary | ICD-10-CM | POA: Insufficient documentation

## 2015-10-09 DIAGNOSIS — Y990 Civilian activity done for income or pay: Secondary | ICD-10-CM | POA: Insufficient documentation

## 2015-10-09 DIAGNOSIS — Z87891 Personal history of nicotine dependence: Secondary | ICD-10-CM | POA: Insufficient documentation

## 2015-10-09 DIAGNOSIS — Y929 Unspecified place or not applicable: Secondary | ICD-10-CM | POA: Insufficient documentation

## 2015-10-09 NOTE — ED Notes (Signed)
Patient presents with pain, bruising, and swelling to her RIGHT foot/ankle. Patient reports that she rolled her ankle after falling down four steps while photographing a wedding. (+) PMS noted; warm and dry.

## 2015-10-10 ENCOUNTER — Emergency Department
Admission: EM | Admit: 2015-10-10 | Discharge: 2015-10-10 | Disposition: A | Discharge status: Home / Self Care | Payer: BLUE CROSS/BLUE SHIELD | Attending: Emergency Medicine | Admitting: Emergency Medicine

## 2015-10-10 ENCOUNTER — Emergency Department: Payer: BLUE CROSS/BLUE SHIELD

## 2015-10-10 DIAGNOSIS — S92901A Unspecified fracture of right foot, initial encounter for closed fracture: Secondary | ICD-10-CM

## 2015-10-10 MED ORDER — IBUPROFEN 800 MG PO TABS: 800.0000 mg | ORAL_TABLET | Freq: Three times a day (TID) | ORAL | Status: DC | PRN

## 2015-10-10 MED ORDER — OXYCODONE-ACETAMINOPHEN 5-325 MG PO TABS
1.0000 | ORAL_TABLET | Freq: Once | ORAL | Status: AC
  Administered 2015-10-10: 1 via ORAL

## 2015-10-10 MED ORDER — OXYCODONE-ACETAMINOPHEN 5-325 MG PO TABS
ORAL_TABLET | ORAL | Status: AC
  Administered 2015-10-10: 1 via ORAL
  Filled 2015-10-10: qty 1

## 2015-10-10 MED ORDER — OXYCODONE-ACETAMINOPHEN 5-325 MG PO TABS: 1.0000 | ORAL_TABLET | ORAL | Status: DC | PRN

## 2015-10-10 MED ORDER — OXYCODONE-ACETAMINOPHEN 5-325 MG PO TABS: 1.0000 | ORAL_TABLET | ORAL | Status: AC | PRN

## 2015-10-10 NOTE — ED Notes (Signed)
Pt. Going home with friend 

## 2015-10-10 NOTE — ED Notes (Signed)
Pt. Stated she stepped off some stairs this afternoon around noon and turned her right ankle.  Pt. Denies LOC. Pt. Has swelling to lower right extremity.

## 2015-10-10 NOTE — Discharge Instructions (Signed)
1. You were seen for right foot fracture of the cuboid bone. Please wear podiatric shoe as directed and usual crutches to walk.  2. Do not place weight on your right foot. Elevate affected area and apply ice several times daily for the next 3 days.  3. You may take ibuprofen as needed for pain, Percocet #30 as needed for more severe pain.  4. Return to the ER for worsening symptoms or concerns.

## 2015-10-10 NOTE — ED Provider Notes (Signed)
[facility] Emergency Department Provider Note   ____________________________________________  Time seen: Approximately 2:59 AM  I have reviewed the triage vital signs and the nursing notes.   HISTORY  Chief Complaint Foot Injury    HPI Deborah Morris is a 50 y.o. female who presents to the ED from work with a chief complaint of right foot pain/injury. Patient works as a Advertising account executivewedding photographer and rolled her foot/ankle after falling down 4 steps. Injury occurred at approximately noon. She took some ibuprofen and work through her injury and now presents to the ED over 12 hours after initial injury. Denies striking head or LOC. Denies associated neck pain, vision changes, chest pain, shortness of breath, abdominal pain, nausea, vomiting, diarrhea. Nothing makes her symptoms that are. Movement makes her symptoms worse.   Past medical history None  There are no active problems to display for this patient.   Past Surgical History  Procedure Laterality Date  . Abdominal hysterectomy    . Tubal ligation    . Dilation and curettage of uterus    . Repair vaginal cuff N/A 02/11/2015    Procedure: REPAIR VAGINAL CUFF;  Surgeon: Elenora Fenderhelsea C Ward, MD;  Location: [facility] ORS;  Service: Gynecology;  Laterality: N/A;    Current Outpatient Rx  Name  Route  Sig  Dispense  Refill  . cephALEXin (KEFLEX) 500 MG capsule   Oral   Take 1 capsule (500 mg total) by mouth 3 (three) times daily.   30 capsule   0   . oxyCODONE-acetaminophen (ROXICET) 5-325 MG tablet   Oral   Take 1 tablet by mouth every 4 (four) hours as needed for severe pain.   30 tablet   0   . predniSONE (DELTASONE) 20 MG tablet      3 tabs po qd for 2 days, then 2 tabs po qd for 3 days, then 1 tab po qd for 3 days, then half a tab po qd for 2 days   16 tablet   0     Allergies Review of patient's allergies indicates no known allergies.  No family history on file.  Social History Social  History  Substance Use Topics  . Smoking status: Former Smoker -- 1.00 packs/day    Types: Cigarettes    Quit date: 08/27/2001  . Smokeless tobacco: Never Used  . Alcohol Use: No    Review of Systems  Constitutional: No fever/chills. Eyes: No visual changes. ENT: No sore throat. Cardiovascular: Denies chest pain. Respiratory: Denies shortness of breath. Gastrointestinal: No abdominal pain.  No nausea, no vomiting.  No diarrhea.  No constipation. Genitourinary: Negative for dysuria. Musculoskeletal: Positive for right foot pain. Negative for back pain. Skin: Negative for rash. Neurological: Negative for headaches, focal weakness or numbness.  10-point ROS otherwise negative.  ____________________________________________   PHYSICAL EXAM:  VITAL SIGNS: ED Triage Vitals  Enc Vitals Group     BP 10/09/15 2335 132/88 mmHg     Pulse Rate 10/09/15 2335 106     Resp 10/09/15 2335 18     Temp 10/09/15 2335 98.6 F (37 C)     Temp Source 10/09/15 2335 Oral     SpO2 10/09/15 2335 98 %     Weight 10/09/15 2335 170 lb (77.111 kg)     Height --      Head Cir --      Peak Flow --      Pain Score 10/09/15 2336 7     Pain  Loc --      Pain Edu? --      Excl. in GC? --     Constitutional: Alert and oriented. Well appearing and in no acute distress. Eyes: Conjunctivae are normal. PERRL. EOMI. Head: Atraumatic. Nose: No congestion/rhinnorhea. Mouth/Throat: Mucous membranes are moist.  Oropharynx non-erythematous. Neck: No stridor.  No cervical spine tenderness to palpation. Cardiovascular: Normal rate, regular rhythm. Grossly normal heart sounds.  Good peripheral circulation. Respiratory: Normal respiratory effort.  No retractions. Lungs CTAB. Gastrointestinal: Soft and nontender. No distention. No abdominal bruits. No CVA tenderness. Musculoskeletal: Right lateral foot with swelling and ecchymosis. Medial foot with less swelling and ecchymosis. Decreased range of motion secondary  to pain. 2+ distal pulses. Brisk, less than 5 second capillary refill. Symmetrically warm limb without evidence for compartment syndrome. Neurologic:  Normal speech and language. No gross focal neurologic deficits are appreciated. Gait not tested. Skin:  Skin is warm, dry and intact. No rash noted. Psychiatric: Mood and affect are normal. Speech and behavior are normal.  ____________________________________________   LABS (all labs ordered are listed, but only abnormal results are displayed)  Labs Reviewed - No data to display ____________________________________________  EKG  None ____________________________________________  RADIOLOGY  Right foot x-rays (viewed by me, interpreted per Dr. Cherly Hensen): 1. Apparent mildly comminuted fracture of the cuboid. Would correlate with the patient's symptoms. 2. Os naviculare noted. ____________________________________________   PROCEDURES  Procedure(s) performed: None  Critical Care performed: No  ____________________________________________   INITIAL IMPRESSION / ASSESSMENT AND PLAN / ED COURSE  Pertinent labs & imaging results that were available during my care of the patient were reviewed by me and considered in my medical decision making (see chart for details).  Review of presents with right foot fracture approximately 13 hours after injury. Will place in podiatric shoe, analgesia and follow-up with with podiatry next week. Patient has crutches at home that she will use. I advised her not to bear weight on affected foot. Strict return precautions given. Patient and friend verbalize understanding and agree with plan of care. ____________________________________________   FINAL CLINICAL IMPRESSION(S) / ED DIAGNOSES  Final diagnoses:  Foot fracture, right, closed, initial encounter      NEW MEDICATIONS STARTED DURING THIS VISIT:  Discharge Medication List as of 10/10/2015  3:20 AM    START taking these medications    Details  oxyCODONE-acetaminophen (ROXICET) 5-325 MG tablet Take 1 tablet by mouth every 4 (four) hours as needed for severe pain., Starting 10/10/2015, Until Discontinued, Print         Note:  This document was prepared using Dragon voice recognition software and may include unintentional dictation errors.    Irean Hong, MD 10/10/15 [phone]

## 2015-10-26 NOTE — ED Provider Notes (Signed)
CSN: [id number]     Arrival date & time 09/20/15  1904 History   First MD Initiated Contact with Patient 09/20/15 2002     Chief Complaint  Patient presents with  . Poison Ivy   (Consider location/radiation/quality/duration/timing/severity/associated sxs/prior Treatment) HPI Comments: 50 yo female with a 4 days h/o itchy rash, blisters and now progressively worsening redness and warmth. Denies fevers, chills, pus drainage.  Patient is a 50 y.o. female presenting with poison ivy. The history is provided by the patient.  Poison Ivy    History reviewed. No pertinent past medical history. Past Surgical History  Procedure Laterality Date  . Abdominal hysterectomy    . Tubal ligation    . Dilation and curettage of uterus    . Repair vaginal cuff N/A 02/11/2015    Procedure: REPAIR VAGINAL CUFF;  Surgeon: Elenora Fenderhelsea C Ward, MD;  Location: [facility] ORS;  Service: Gynecology;  Laterality: N/A;   No family history on file. Social History  Substance Use Topics  . Smoking status: Former Smoker -- 1.00 packs/day    Types: Cigarettes    Quit date: 08/27/2001  . Smokeless tobacco: Never Used  . Alcohol Use: No   OB History    No data available     Review of Systems  Allergies  Review of patient's allergies indicates no known allergies.  Home Medications   Prior to Admission medications   Medication Sig Start Date End Date Taking? Authorizing Provider  cephALEXin (KEFLEX) 500 MG capsule Take 1 capsule (500 mg total) by mouth 3 (three) times daily. 09/20/15   Payton Mccallumrlando Daylynn Stumpp, MD  oxyCODONE-acetaminophen (ROXICET) 5-325 MG tablet Take 1 tablet by mouth every 4 (four) hours as needed for severe pain. 10/10/15   Irean HongJade J Sung, MD  predniSONE (DELTASONE) 20 MG tablet 3 tabs po qd for 2 days, then 2 tabs po qd for 3 days, then 1 tab po qd for 3 days, then half a tab po qd for 2 days 09/20/15   Payton Mccallumrlando Sharica Roedel, MD   Meds Ordered and Administered this Visit  Medications - No data to display  BP 115/83 mmHg   Pulse 85  Temp(Src) 97.7 F (36.5 C) (Oral)  Resp 16  Ht 5\' 3"  (1.6 m)  Wt 160 lb (72.576 kg)  BMI 28.35 kg/m2  SpO2 100% No data found.   Physical Exam  Constitutional: She appears well-developed and well-nourished. No distress.  Cardiovascular: Normal rate, regular rhythm and normal heart sounds.   Pulmonary/Chest: Effort normal and breath sounds normal. No respiratory distress. She has no wheezes. She has no rales.  Skin: Rash (erythematous, scaly rash with few pinpoint blisters and  surrounding diffuse blanchable erythema , warmth and tenderness to palpation) noted. She is not diaphoretic.  Nursing note and vitals reviewed.   ED Course  Procedures (including critical care time)  Labs Review Labs Reviewed - No data to display  Imaging Review No results found.   Visual Acuity Review  Right Eye Distance:   Left Eye Distance:   Bilateral Distance:    Right Eye Near:   Left Eye Near:    Bilateral Near:         MDM   1. Contact dermatitis due to poison ivy   2. Cellulitis of lower extremity, unspecified laterality    Discharge Medication List as of 09/20/2015  8:28 PM    START taking these medications   Details  cephALEXin (KEFLEX) 500 MG capsule Take 1 capsule (500 mg total) by mouth 3 (  three) times daily., Starting 09/20/2015, Until Discontinued, Normal    predniSONE (DELTASONE) 20 MG tablet 3 tabs po qd for 2 days, then 2 tabs po qd for 3 days, then 1 tab po qd for 3 days, then half a tab po qd for 2 days, Normal       1. diagnosis reviewed with patient 2. rx as per orders above; reviewed possible side effects, interactions, risks and benefits  3. Recommend supportive treatment with otc benadryl prn 4. Follow-up prn if symptoms worsen or don't improve    Payton Mccallum, MD 10/26/15 1920

## 2016-10-22 IMAGING — US US PELV - US TRANSVAGINAL
1 series · 13 of 25 positions shown · non-contrast
Comparison: None.

CLINICAL DATA: Two day history of right-sided pelvic pain

EXAM:
TRANSABDOMINAL AND TRANSVAGINAL ULTRASOUND OF PELVIS
DOPPLER ULTRASOUND OF OVARIES
TECHNIQUE: Study was performed transabdominally to optimize pelvic field of
view evaluation and transvaginally to optimize internal visceral
architecture evaluation.
Color and duplex Doppler ultrasound was utilized to evaluate blood
flow to the ovaries.

[Series 1: us pelv - us transvaginal · 0.28mm/px · 13 of 95 slices shown]
[im 1/95]
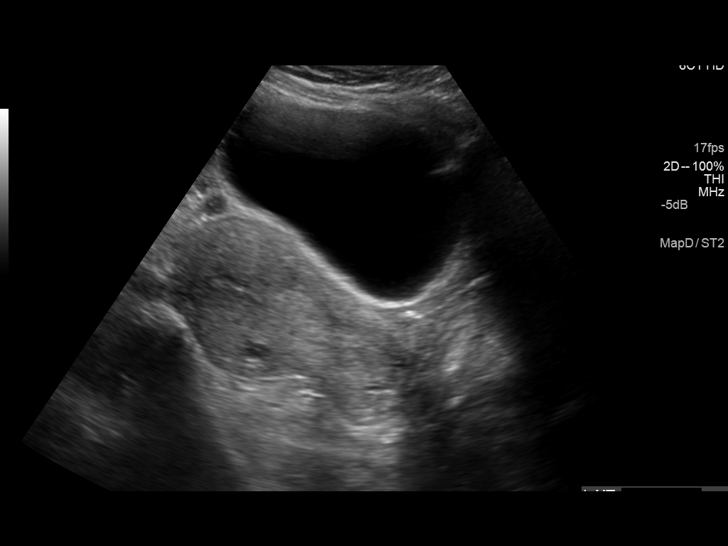
[im 8/95]
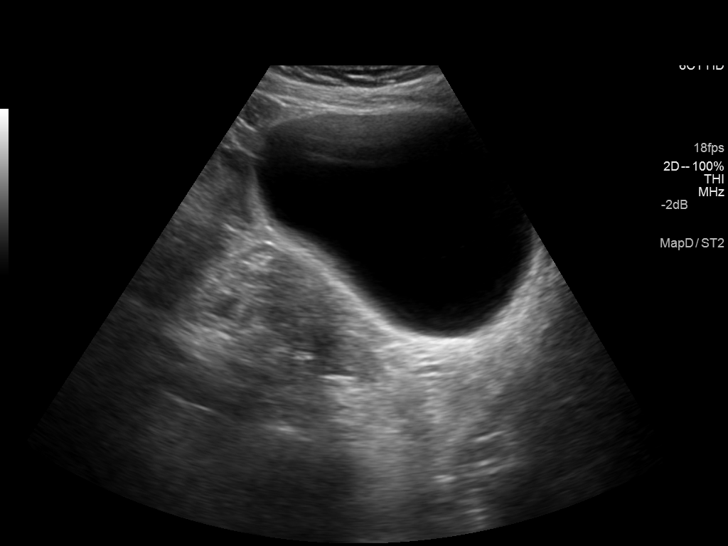
[im 16/95]
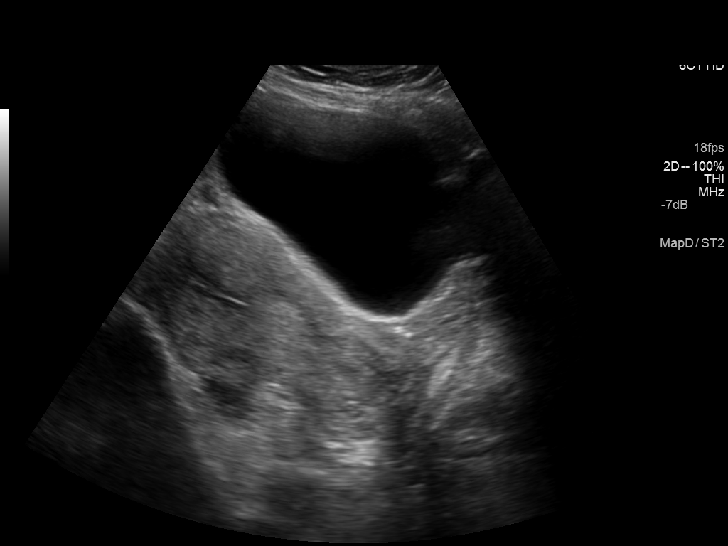
[im 24/95]
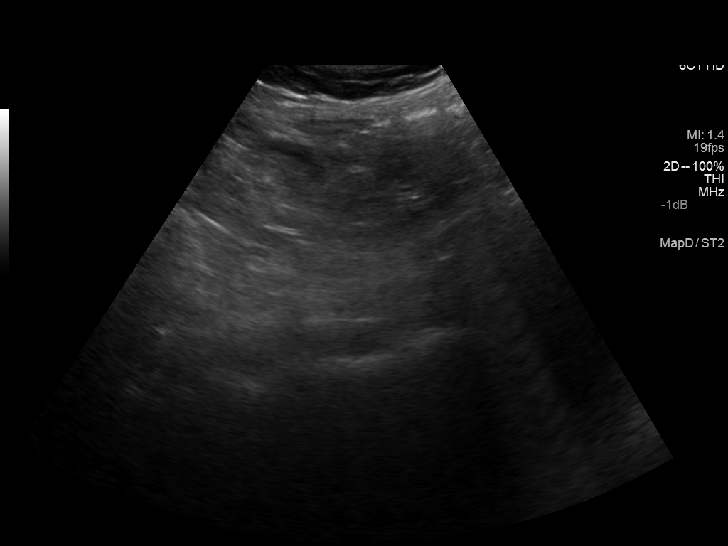
[im 32/95]
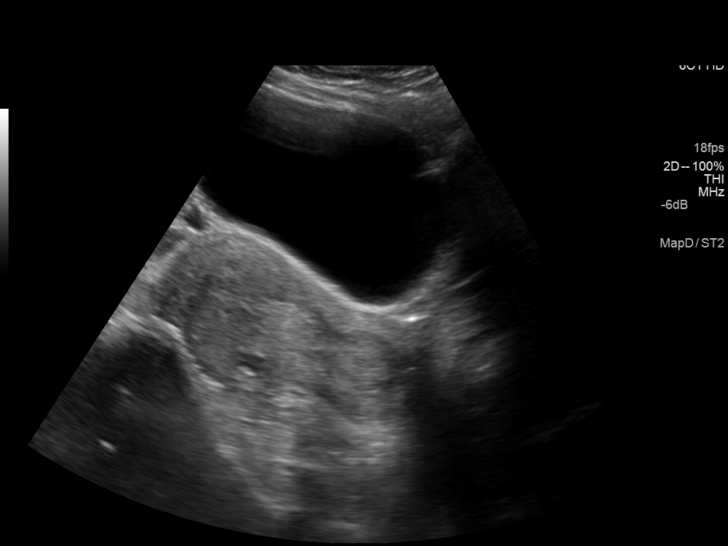
[im 40/95]
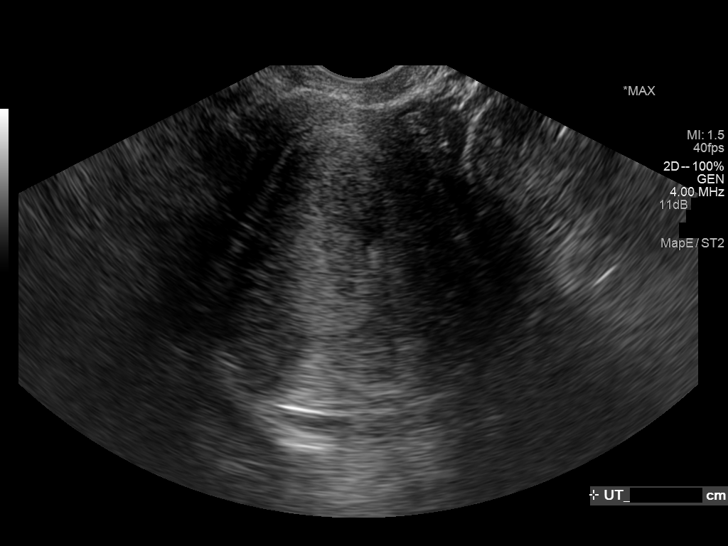
[im 48/95]
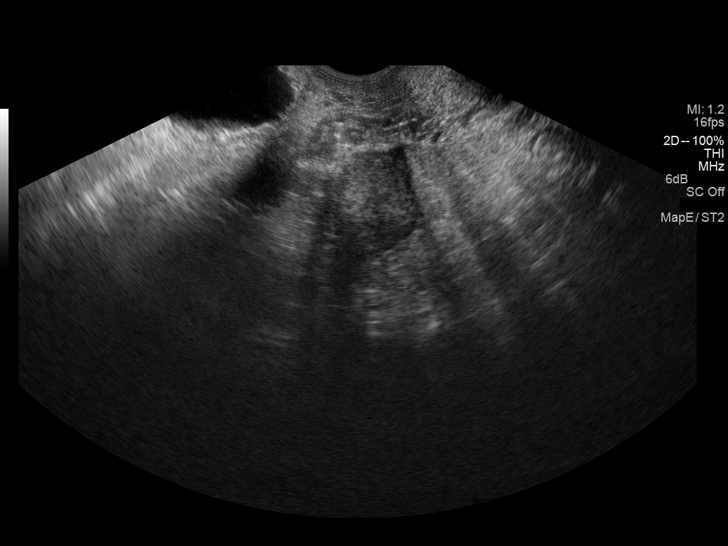
[im 55/95]
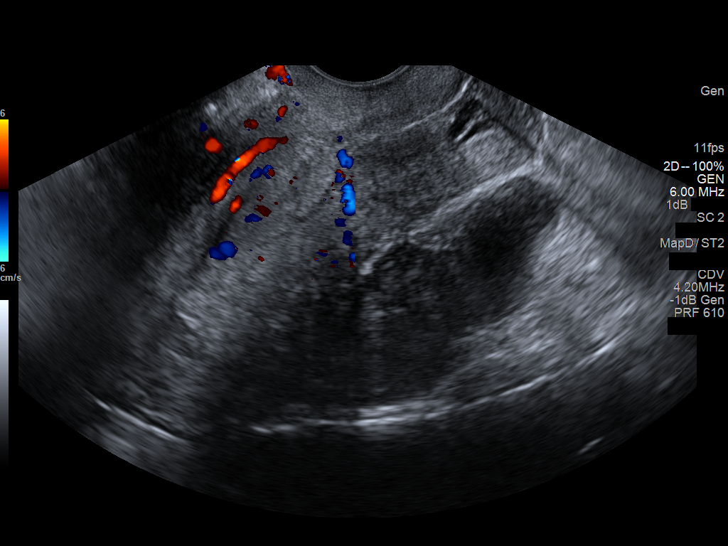
[im 63/95]
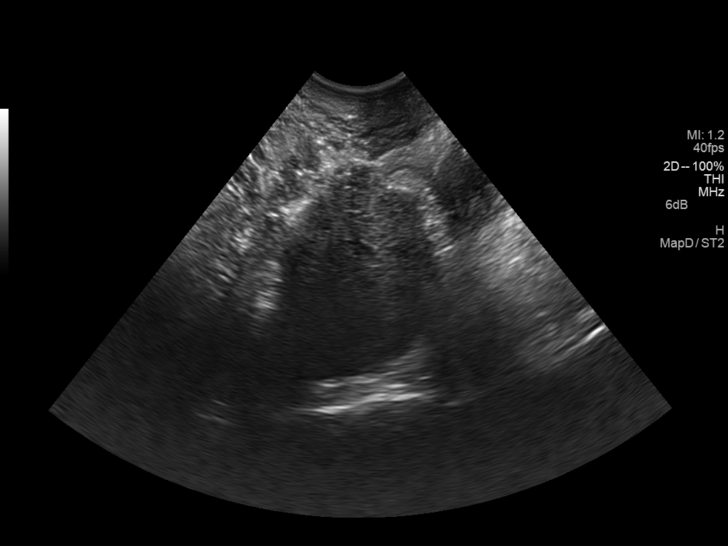
[im 71/95]
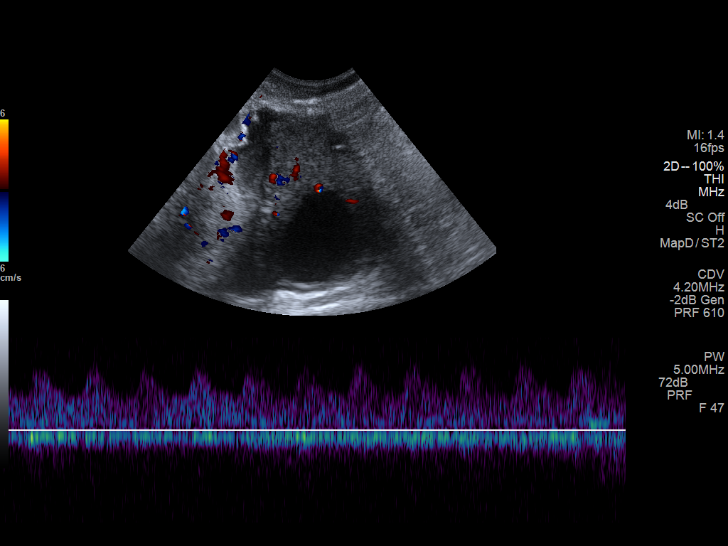
[im 79/95]
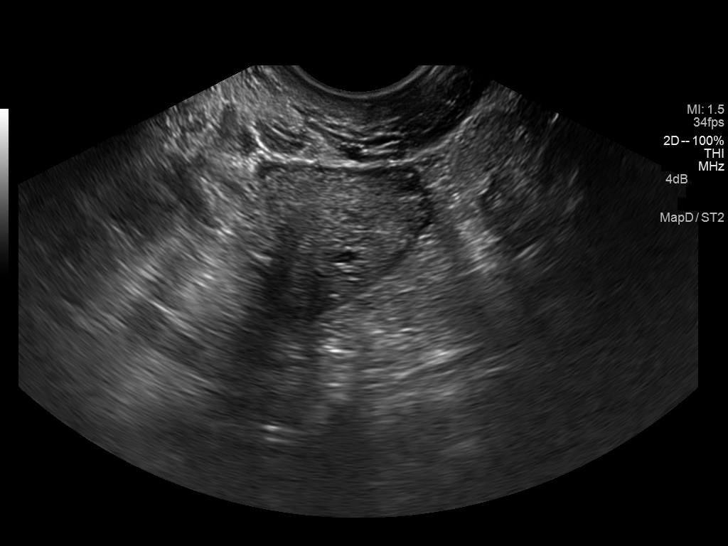
[im 87/95]
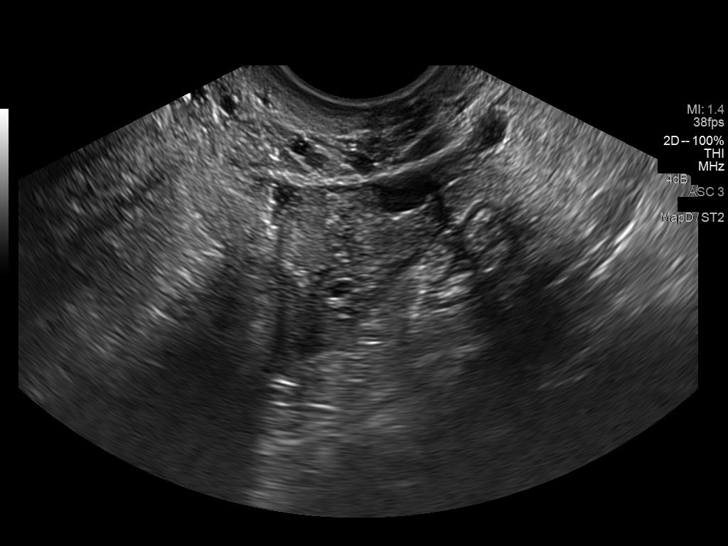
[im 95/95]
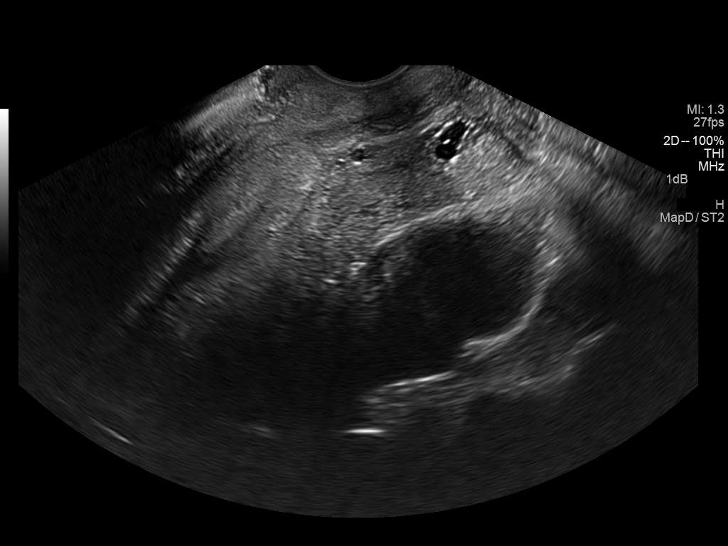

[13 of 25 positions shown; findings below may reference images not displayed]

FINDINGS: Uterus

Measurements: 7.1 x 6.0 x 6.4 cm. No fibroids or other mass
visualized. There are several subcentimeter cervical nabothian
cysts. Largest nabothian cyst measures 7 x 7 x 5 mm.

Endometrium

Thickness: 14 mm.  No focal abnormality visualized.

Right ovary

Measurements: 3.5 x 2.6 x 2.6 cm. Normal appearance/no adnexal mass
except for a physiologic dominant follicle measuring 2.2 x 1.9 x
cm.

Left ovary

Measurements: 3.4 x 2.5 x 2.1 cm. Normal appearance/no adnexal mass.

Pulsed Doppler evaluation of both ovaries demonstrates normal
low-resistance arterial and venous waveforms. The peak systolic
velocity in the right ovary is 10 centimeter/second. The peak
systolic velocity in the left ovary is 12 centimeter/seconds.

Other findings

No free fluid.
IMPRESSION: Subcentimeter cervical nabothian cysts. Dominant follicle right
ovary. Study otherwise unremarkable. No ovarian torsion. No
intrauterine or extrauterine pelvic mass beyond physiologic follicle
right ovary. No free pelvic fluid.

## 2018-03-10 IMAGING — CR DG FOOT COMPLETE 3+V*R*
1 series · 3 of 3 positions shown · non-contrast
Comparison: None.

CLINICAL DATA: Missed a step and hit right foot, with pain at the
fifth metatarsal, bruising and swelling. Initial encounter.

EXAM:
RIGHT FOOT COMPLETE - 3+ VIEW

[Series 1: dg foot complete right · 0.14mm/px · 3 of 3 slices shown]
[im 1/3]
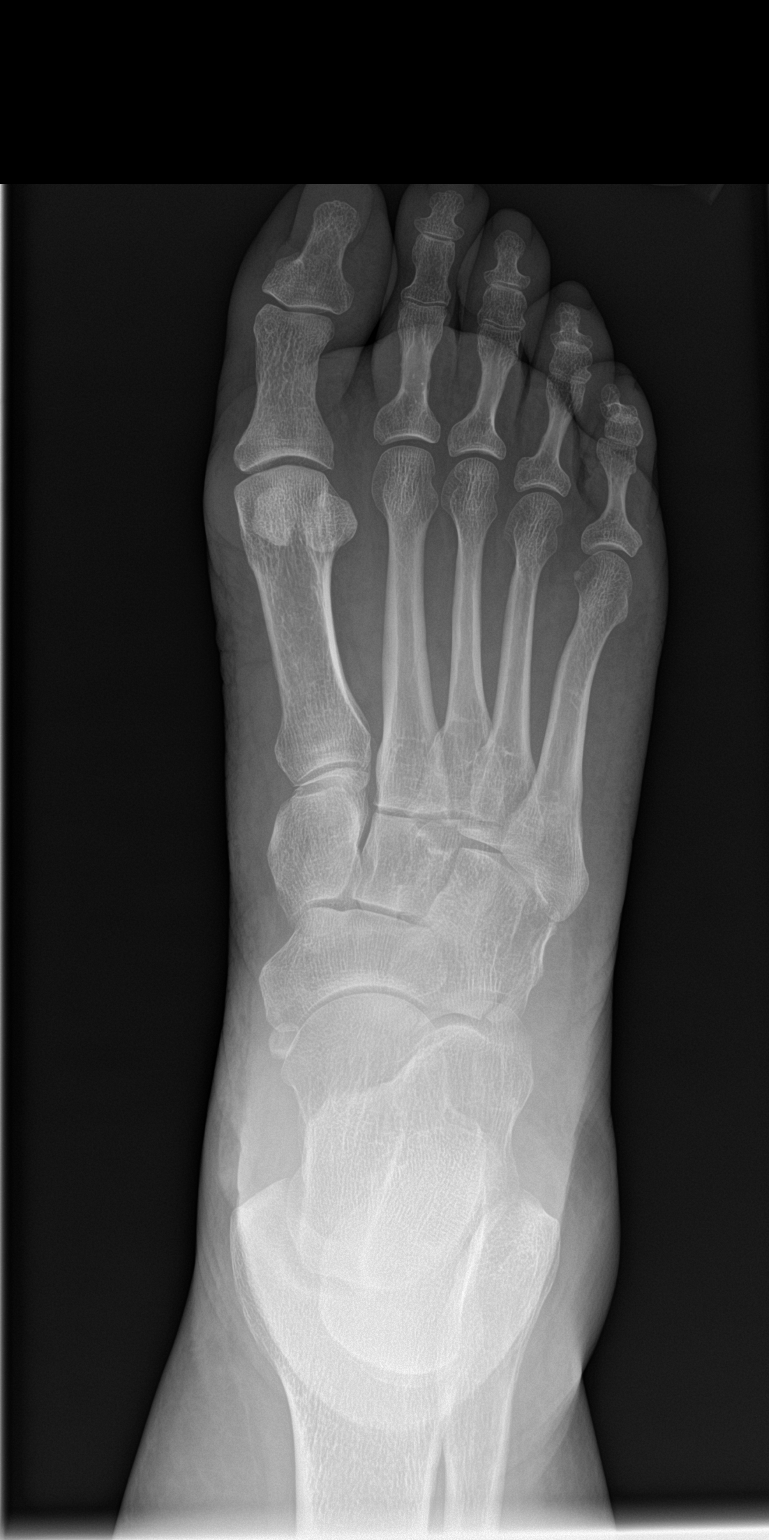
[im 2/3]
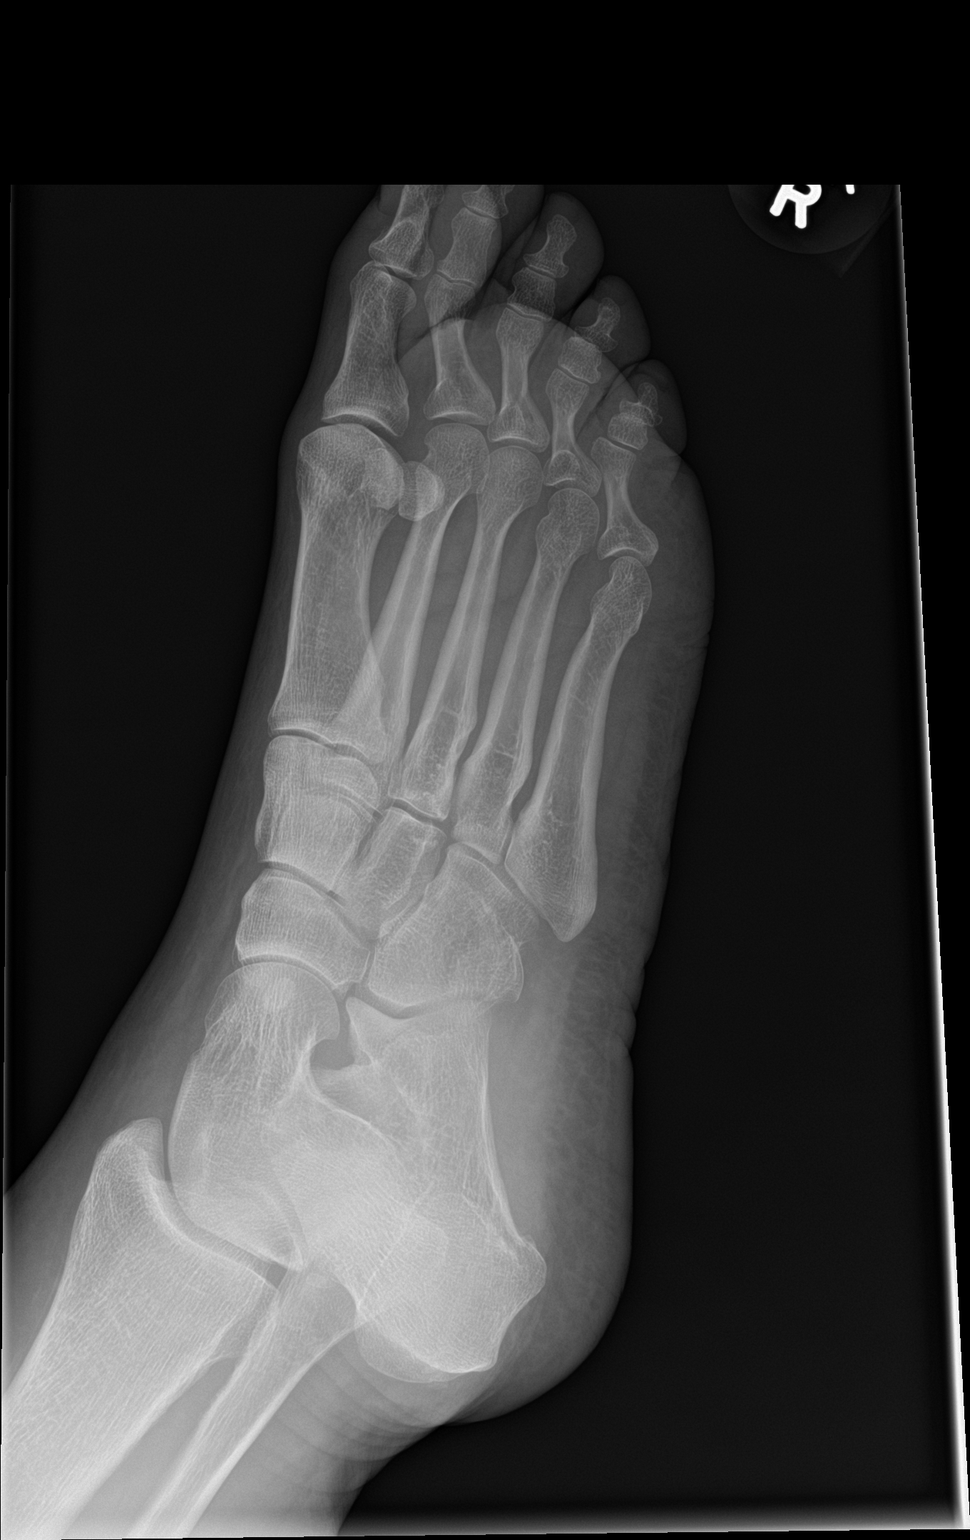
[im 3/3]
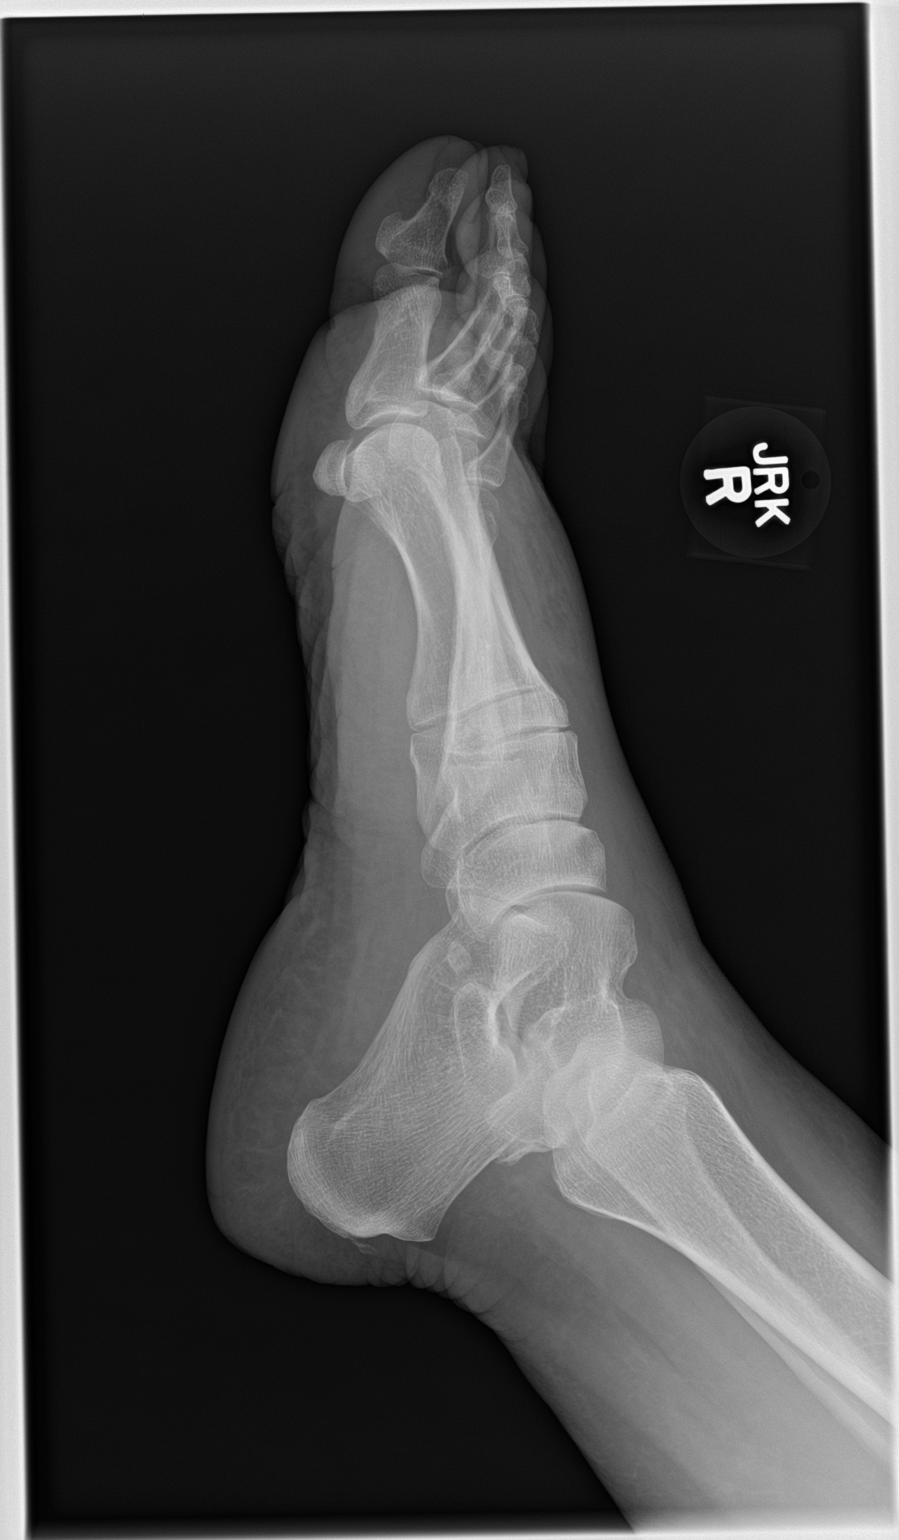

[3 of 3 positions shown; findings below may reference images not displayed]

FINDINGS: There appears to be a mildly comminuted fracture of the cuboid.

The joint spaces are preserved. There is no evidence of talar
subluxation; the subtalar joint is unremarkable in appearance. An os
naviculare is noted.

No significant soft tissue abnormalities are seen.
IMPRESSION: 1. Apparent mildly comminuted fracture of the cuboid. Would
correlate with the patient's symptoms.
2. Os naviculare noted.

## 2022-06-19 DIAGNOSIS — R051 Acute cough: Secondary | ICD-10-CM | POA: Diagnosis not present

## 2022-06-19 DIAGNOSIS — Z6829 Body mass index (BMI) 29.0-29.9, adult: Secondary | ICD-10-CM | POA: Diagnosis not present

## 2022-07-09 DIAGNOSIS — R051 Acute cough: Secondary | ICD-10-CM | POA: Diagnosis not present

## 2022-07-09 DIAGNOSIS — Z6829 Body mass index (BMI) 29.0-29.9, adult: Secondary | ICD-10-CM | POA: Diagnosis not present

## 2022-07-09 DIAGNOSIS — J209 Acute bronchitis, unspecified: Secondary | ICD-10-CM | POA: Diagnosis not present

## 2022-12-08 DIAGNOSIS — Z1159 Encounter for screening for other viral diseases: Secondary | ICD-10-CM | POA: Diagnosis not present

## 2022-12-08 DIAGNOSIS — R202 Paresthesia of skin: Secondary | ICD-10-CM | POA: Diagnosis not present

## 2022-12-08 DIAGNOSIS — Z124 Encounter for screening for malignant neoplasm of cervix: Secondary | ICD-10-CM | POA: Diagnosis not present

## 2022-12-08 DIAGNOSIS — Z1231 Encounter for screening mammogram for malignant neoplasm of breast: Secondary | ICD-10-CM | POA: Diagnosis not present

## 2022-12-08 DIAGNOSIS — Z0000 Encounter for general adult medical examination without abnormal findings: Secondary | ICD-10-CM | POA: Diagnosis not present

## 2022-12-08 DIAGNOSIS — Z1211 Encounter for screening for malignant neoplasm of colon: Secondary | ICD-10-CM | POA: Diagnosis not present

## 2022-12-08 DIAGNOSIS — Z90710 Acquired absence of both cervix and uterus: Secondary | ICD-10-CM | POA: Diagnosis not present

## 2022-12-08 DIAGNOSIS — Z7689 Persons encountering health services in other specified circumstances: Secondary | ICD-10-CM | POA: Diagnosis not present

## 2022-12-26 DIAGNOSIS — Z1211 Encounter for screening for malignant neoplasm of colon: Secondary | ICD-10-CM | POA: Diagnosis not present

## 2022-12-30 LAB — EXTERNAL GENERIC LAB PROCEDURE: COLOGUARD: NEGATIVE

## 2022-12-30 LAB — COLOGUARD: COLOGUARD: NEGATIVE

## 2023-06-06 DIAGNOSIS — R202 Paresthesia of skin: Secondary | ICD-10-CM | POA: Diagnosis not present

## 2023-06-06 DIAGNOSIS — M5412 Radiculopathy, cervical region: Secondary | ICD-10-CM | POA: Diagnosis not present

## 2023-06-06 DIAGNOSIS — M4802 Spinal stenosis, cervical region: Secondary | ICD-10-CM | POA: Diagnosis not present

## 2023-06-08 ENCOUNTER — Other Ambulatory Visit: Payer: Self-pay | Admitting: Physician Assistant

## 2023-06-08 ENCOUNTER — Encounter: Payer: Self-pay | Admitting: Physician Assistant

## 2023-06-08 DIAGNOSIS — M4802 Spinal stenosis, cervical region: Secondary | ICD-10-CM

## 2023-06-09 ENCOUNTER — Ambulatory Visit
Admission: RE | Admit: 2023-06-09 | Discharge: 2023-06-09 | Disposition: A | Discharge status: Home / Self Care | Payer: Self-pay | Source: Ambulatory Visit | Attending: Physician Assistant | Admitting: Physician Assistant

## 2023-06-09 DIAGNOSIS — R202 Paresthesia of skin: Secondary | ICD-10-CM | POA: Diagnosis not present

## 2023-06-09 DIAGNOSIS — M542 Cervicalgia: Secondary | ICD-10-CM | POA: Diagnosis not present

## 2023-06-09 DIAGNOSIS — M4802 Spinal stenosis, cervical region: Secondary | ICD-10-CM

## 2023-06-25 ENCOUNTER — Other Ambulatory Visit: Payer: Self-pay | Admitting: Internal Medicine

## 2023-06-25 DIAGNOSIS — Z1231 Encounter for screening mammogram for malignant neoplasm of breast: Secondary | ICD-10-CM

## 2023-07-10 ENCOUNTER — Ambulatory Visit
Admission: RE | Admit: 2023-07-10 | Discharge: 2023-07-10 | Disposition: A | Discharge status: Home / Self Care | Payer: 59 | Source: Ambulatory Visit | Attending: Internal Medicine | Admitting: Internal Medicine

## 2023-07-10 DIAGNOSIS — L989 Disorder of the skin and subcutaneous tissue, unspecified: Secondary | ICD-10-CM | POA: Diagnosis not present

## 2023-07-10 DIAGNOSIS — R202 Paresthesia of skin: Secondary | ICD-10-CM | POA: Diagnosis not present

## 2023-07-10 DIAGNOSIS — R7303 Prediabetes: Secondary | ICD-10-CM | POA: Diagnosis not present

## 2023-07-10 DIAGNOSIS — M4802 Spinal stenosis, cervical region: Secondary | ICD-10-CM | POA: Diagnosis not present

## 2023-07-10 DIAGNOSIS — Z1231 Encounter for screening mammogram for malignant neoplasm of breast: Secondary | ICD-10-CM | POA: Diagnosis not present

## 2023-07-10 DIAGNOSIS — M5412 Radiculopathy, cervical region: Secondary | ICD-10-CM | POA: Diagnosis not present

## 2023-07-13 ENCOUNTER — Other Ambulatory Visit: Payer: Self-pay | Admitting: Internal Medicine

## 2023-07-13 DIAGNOSIS — N6489 Other specified disorders of breast: Secondary | ICD-10-CM

## 2023-07-13 DIAGNOSIS — R928 Other abnormal and inconclusive findings on diagnostic imaging of breast: Secondary | ICD-10-CM

## 2023-07-18 ENCOUNTER — Ambulatory Visit
Admission: RE | Admit: 2023-07-18 | Discharge: 2023-07-18 | Disposition: A | Discharge status: Home / Self Care | Payer: 59 | Source: Ambulatory Visit | Attending: Internal Medicine | Admitting: Internal Medicine

## 2023-07-18 DIAGNOSIS — R928 Other abnormal and inconclusive findings on diagnostic imaging of breast: Secondary | ICD-10-CM | POA: Insufficient documentation

## 2023-07-18 DIAGNOSIS — R92321 Mammographic fibroglandular density, right breast: Secondary | ICD-10-CM | POA: Diagnosis not present

## 2023-07-18 DIAGNOSIS — N6489 Other specified disorders of breast: Secondary | ICD-10-CM | POA: Insufficient documentation

## 2023-07-24 DIAGNOSIS — D2372 Other benign neoplasm of skin of left lower limb, including hip: Secondary | ICD-10-CM | POA: Diagnosis not present

## 2023-07-24 DIAGNOSIS — D235 Other benign neoplasm of skin of trunk: Secondary | ICD-10-CM | POA: Diagnosis not present

## 2023-07-24 DIAGNOSIS — D2362 Other benign neoplasm of skin of left upper limb, including shoulder: Secondary | ICD-10-CM | POA: Diagnosis not present

## 2023-07-24 DIAGNOSIS — L905 Scar conditions and fibrosis of skin: Secondary | ICD-10-CM | POA: Diagnosis not present

## 2023-07-24 DIAGNOSIS — D2361 Other benign neoplasm of skin of right upper limb, including shoulder: Secondary | ICD-10-CM | POA: Diagnosis not present

## 2023-09-04 DIAGNOSIS — R200 Anesthesia of skin: Secondary | ICD-10-CM | POA: Diagnosis not present

## 2023-10-17 DIAGNOSIS — M7918 Myalgia, other site: Secondary | ICD-10-CM | POA: Diagnosis not present

## 2023-10-17 DIAGNOSIS — G5603 Carpal tunnel syndrome, bilateral upper limbs: Secondary | ICD-10-CM | POA: Diagnosis not present

## 2023-10-17 DIAGNOSIS — M542 Cervicalgia: Secondary | ICD-10-CM | POA: Diagnosis not present

## 2023-10-30 DIAGNOSIS — G5602 Carpal tunnel syndrome, left upper limb: Secondary | ICD-10-CM | POA: Diagnosis not present

## 2023-10-30 DIAGNOSIS — G5601 Carpal tunnel syndrome, right upper limb: Secondary | ICD-10-CM | POA: Diagnosis not present

## 2023-12-04 ENCOUNTER — Encounter: Payer: Self-pay | Admitting: Internal Medicine

## 2023-12-05 ENCOUNTER — Other Ambulatory Visit: Payer: Self-pay | Admitting: Internal Medicine

## 2023-12-05 DIAGNOSIS — N6489 Other specified disorders of breast: Secondary | ICD-10-CM

## 2023-12-06 DIAGNOSIS — G5601 Carpal tunnel syndrome, right upper limb: Secondary | ICD-10-CM | POA: Diagnosis not present

## 2023-12-06 DIAGNOSIS — G5602 Carpal tunnel syndrome, left upper limb: Secondary | ICD-10-CM | POA: Diagnosis not present

## 2023-12-19 DIAGNOSIS — R7303 Prediabetes: Secondary | ICD-10-CM | POA: Diagnosis not present

## 2023-12-19 DIAGNOSIS — M5412 Radiculopathy, cervical region: Secondary | ICD-10-CM | POA: Diagnosis not present

## 2024-01-22 ENCOUNTER — Ambulatory Visit
Admission: RE | Admit: 2024-01-22 | Discharge: 2024-01-22 | Disposition: A | Discharge status: Home / Self Care | Source: Ambulatory Visit | Attending: Internal Medicine | Admitting: Internal Medicine

## 2024-01-22 DIAGNOSIS — R92333 Mammographic heterogeneous density, bilateral breasts: Secondary | ICD-10-CM | POA: Diagnosis not present

## 2024-01-22 DIAGNOSIS — N6489 Other specified disorders of breast: Secondary | ICD-10-CM | POA: Diagnosis not present

## 2024-01-22 DIAGNOSIS — R928 Other abnormal and inconclusive findings on diagnostic imaging of breast: Secondary | ICD-10-CM | POA: Diagnosis not present

## 2024-02-11 DIAGNOSIS — M542 Cervicalgia: Secondary | ICD-10-CM | POA: Diagnosis not present

## 2024-02-25 DIAGNOSIS — M542 Cervicalgia: Secondary | ICD-10-CM | POA: Diagnosis not present

## 2024-04-28 DIAGNOSIS — G5603 Carpal tunnel syndrome, bilateral upper limbs: Secondary | ICD-10-CM | POA: Diagnosis not present

## 2024-04-28 DIAGNOSIS — M542 Cervicalgia: Secondary | ICD-10-CM | POA: Diagnosis not present

## 2024-04-28 DIAGNOSIS — M7918 Myalgia, other site: Secondary | ICD-10-CM | POA: Diagnosis not present

## 2024-05-22 ENCOUNTER — Other Ambulatory Visit: Payer: Self-pay | Admitting: Internal Medicine

## 2024-05-22 DIAGNOSIS — N6489 Other specified disorders of breast: Secondary | ICD-10-CM

## 2024-05-22 DIAGNOSIS — R921 Mammographic calcification found on diagnostic imaging of breast: Secondary | ICD-10-CM

## 2024-07-22 ENCOUNTER — Other Ambulatory Visit: Payer: Self-pay
